# Patient Record
Sex: Female | Born: 1995 | Race: White | Hispanic: No | Marital: Single | Smoking: Never smoker
Health system: Southern US, Community
[De-identification: ages and names within clinical notes are randomized; demographics above are authoritative.]

## PROBLEM LIST (undated history)

## (undated) HISTORY — PX: WISDOM TOOTH EXTRACTION: SHX21

---
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
## 2014-10-01 ENCOUNTER — Emergency Department
Admit: 2014-10-01 | Disposition: A | Discharge status: Home / Self Care | Payer: Self-pay | Admitting: Emergency Medicine

## 2014-10-02 LAB — URINALYSIS, COMPLETE
BILIRUBIN, UR: NEGATIVE
Blood: NEGATIVE
GLUCOSE, UR: NEGATIVE mg/dL (ref 0–75)
KETONE: NEGATIVE
LEUKOCYTE ESTERASE: NEGATIVE
Nitrite: NEGATIVE
PH: 6 (ref 4.5–8.0)
Protein: NEGATIVE
Specific Gravity: 1.016 (ref 1.003–1.030)
WBC UR: 2 /HPF (ref 0–5)

## 2014-10-02 LAB — CBC WITH DIFFERENTIAL/PLATELET
BASOS PCT: 1 %
Basophil #: 0.1 10*3/uL (ref 0.0–0.1)
Eosinophil #: 0.1 10*3/uL (ref 0.0–0.7)
Eosinophil %: 1.5 %
HCT: 39.5 % (ref 35.0–47.0)
HGB: 13 g/dL (ref 12.0–16.0)
LYMPHS ABS: 3.1 10*3/uL (ref 1.0–3.6)
Lymphocyte %: 38.2 %
MCH: 29.2 pg (ref 26.0–34.0)
MCHC: 32.8 g/dL (ref 32.0–36.0)
MCV: 89 fL (ref 80–100)
Monocyte #: 0.5 x10 3/mm (ref 0.2–0.9)
Monocyte %: 6.1 %
Neutrophil #: 4.4 10*3/uL (ref 1.4–6.5)
Neutrophil %: 53.2 %
PLATELETS: 258 10*3/uL (ref 150–440)
RBC: 4.44 10*6/uL (ref 3.80–5.20)
RDW: 12.9 % (ref 11.5–14.5)
WBC: 8.2 10*3/uL (ref 3.6–11.0)

## 2014-10-02 LAB — COMPREHENSIVE METABOLIC PANEL
ALBUMIN: 4 g/dL
AST: 21 U/L
Alkaline Phosphatase: 48 U/L
Anion Gap: 8 (ref 7–16)
BILIRUBIN TOTAL: 0.7 mg/dL
BUN: 10 mg/dL
Calcium, Total: 9.1 mg/dL
Chloride: 105 mmol/L
Co2: 26 mmol/L
Creatinine: 0.71 mg/dL
GLUCOSE: 97 mg/dL
Potassium: 4 mmol/L
SGPT (ALT): 15 U/L
Sodium: 139 mmol/L
Total Protein: 7.3 g/dL

## 2014-10-02 LAB — TROPONIN I: Troponin-I: <0.03 ng/mL

## 2014-10-02 LAB — LIPASE, BLOOD: Lipase: 27 U/L

## 2014-10-04 LAB — URINE CULTURE

## 2015-08-02 ENCOUNTER — Ambulatory Visit (INDEPENDENT_AMBULATORY_CARE_PROVIDER_SITE_OTHER): Payer: No Typology Code available for payment source | Admitting: Obstetrics and Gynecology

## 2015-08-02 ENCOUNTER — Encounter: Payer: Self-pay | Admitting: Obstetrics and Gynecology

## 2015-08-02 VITALS — BP 102/60 | HR 64 | Resp 14 | Ht 63.0 in | Wt 122.0 lb

## 2015-08-02 DIAGNOSIS — R1031 Right lower quadrant pain: Secondary | ICD-10-CM | POA: Diagnosis not present

## 2015-08-02 DIAGNOSIS — Z113 Encounter for screening for infections with a predominantly sexual mode of transmission: Secondary | ICD-10-CM

## 2015-08-02 DIAGNOSIS — Z124 Encounter for screening for malignant neoplasm of cervix: Secondary | ICD-10-CM

## 2015-08-02 DIAGNOSIS — R350 Frequency of micturition: Secondary | ICD-10-CM | POA: Diagnosis not present

## 2015-08-02 DIAGNOSIS — Z01419 Encounter for gynecological examination (general) (routine) without abnormal findings: Secondary | ICD-10-CM | POA: Diagnosis not present

## 2015-08-02 NOTE — Progress Notes (Signed)
Patient ID: Rachael Keller, female   DOB: 10-17-1995, 20 y.o.   MRN: [id number] 20 y.o. [id number] SingleCaucasianF here for annual exam.  She c/o pain right mid abdominal starting 2 days ago, intermittent, getting better, mild. No diarrhea, constipation or gas. No fevers, no nausea or emesis.  She is OCP's, saturates a regular tampon all day. Not currently sexually active, uses condoms. Last active January.  Period Cycle (Days): 28 Period Duration (Days): 3-4 days  Period Pattern: Regular Menstrual Flow: Moderate Menstrual Control: Tampon Dysmenorrhea: (!) Moderate Dysmenorrhea Symptoms: Cramping  Cramps are just intermittent.   Patient's last menstrual period was 07/15/2015.          Sexually active: Yes.    The current method of family planning is OCP (estrogen/progesterone).    Exercising: Yes.    cardio Smoker:  no  Health Maintenance: Pap:  Never History of abnormal Pap:  N/A TDaP:  2014 Gardasil: no    reports that she has never smoked. She has never used smokeless tobacco. She reports that she drinks about 1.8 - 3.0 oz of alcohol per week. She reports that she does not use illicit drugs.She is a Holiday representative at [employer].   History reviewed. No pertinent past medical history.  Past Surgical History  Procedure Laterality Date  . Wisdom tooth extraction      Current Outpatient Prescriptions  Medication Sig Dispense Refill  . norgestimate-ethinyl estradiol (ORTHO-CYCLEN,SPRINTEC,PREVIFEM) 0.25-35 MG-MCG tablet Take 1 tablet by mouth daily.     No current facility-administered medications for this visit.    Family History  Problem Relation Age of Onset  . Heart attack Paternal Uncle   . Cancer Paternal Grandmother   . Cancer Paternal Grandfather     Review of Systems  Constitutional: Negative.   HENT: Negative.   Eyes: Negative.   Respiratory: Negative.   Cardiovascular: Negative.   Gastrointestinal: Negative.   Endocrine: Negative.   Genitourinary:  Negative.        Painful periods  Musculoskeletal: Negative.   Skin: Negative.   Allergic/Immunologic: Negative.   Neurological: Negative.   Psychiatric/Behavioral: Negative.   She c/o frequent urination, voids every couple of hours, normal amounts. She can hold her bladder, no dysuria. She drinks coffee, 1-2 cups a day.  Exam:   BP 102/60 mmHg  Pulse 64  Resp 14  Ht  (1.6 m)  Wt 122 lb (55.339 kg)  BMI 21.62 kg/m2  LMP 07/15/2015  Weight change: @ Height:   Height:  (160 cm)  Ht Readings from Last 3 Encounters:  08/02/15  (1.6 m)  09/25/13  (1.6 m) (31 %*, Z = -0.50)   * Growth percentiles are based on CDC 2-20 Years data.    General appearance: alert, cooperative and appears stated age Head: Normocephalic, without obvious abnormality, atraumatic Neck: no adenopathy, supple, symmetrical, trachea midline and thyroid normal to inspection and palpation Lungs: clear to auscultation bilaterally Breasts: normal appearance, no masses or tenderness Heart: regular rate and rhythm Abdomen: soft, non-tender; bowel sounds normal; no masses,  no organomegaly. +distended Extremities: extremities normal, atraumatic, no cyanosis or edema Skin: Skin color, texture, turgor normal. No rashes or lesions Lymph nodes: Cervical, supraclavicular, and axillary nodes normal. No abnormal inguinal nodes palpated Neurologic: Grossly normal   Pelvic: External genitalia:  no lesions              Urethra:  normal appearing urethra with no masses, tenderness or lesions  Bartholins and Skenes: normal                 Vagina: normal appearing vagina with normal color and discharge, no lesions              Cervix: no lesions               Bimanual Exam:  Uterus:  normal size, contour, position, consistency, mobility, non-tender and anteverted              Adnexa: slightly tender and full in the right adnexa, suspect stool                Rectovaginal: Confirms                Anus:  normal sphincter tone, no lesions  Chaperone was present for exam.  A:  Well Woman with normal exam  Urinary frequency  Abdominal pain x 2 days, right mid abdomen, distended, improving. Suspect GI   P:   Pap  STD testing  If pain persists return for a gyn u/s, suspect her pain is GI  Urine for ua, c&s  Try cutting out caffeine  gardasil information reviewed, she will check with her mother if she has had it  [facility]  Condoms if sexually active  Reviewed breast self exam  Discussed calcium/vit d

## 2015-08-02 NOTE — Patient Instructions (Signed)
EXERCISE AND DIET:  We recommended that you start or continue a regular exercise program for good health. Regular exercise means any activity that makes your heart beat faster and makes you sweat.  We recommend exercising at least 30 minutes per day at least 3 days a week, preferably 4 or 5.  We also recommend a diet low in fat and sugar.  Inactivity, poor dietary choices and obesity can cause diabetes, heart attack, stroke, and kidney damage, among others.    ALCOHOL AND SMOKING:  Women should limit their alcohol intake to no more than 7 drinks/beers/glasses of wine (combined, not each!) per week. Moderation of alcohol intake to this level decreases your risk of breast cancer and liver damage. And of course, no recreational drugs are part of a healthy lifestyle.  And absolutely no smoking or even second hand smoke. Most people know smoking can cause heart and lung diseases, but did you know it also contributes to weakening of your bones? Aging of your skin?  Yellowing of your teeth and nails?  CALCIUM AND VITAMIN D:  Adequate intake of calcium and Vitamin D are recommended.  The recommendations for exact amounts of these supplements seem to change often, but generally speaking 600 mg of calcium (either carbonate or citrate) and 800 units of Vitamin D per day seems prudent. Certain women may benefit from higher intake of Vitamin D.  If you are among these women, your doctor will have told you during your visit.    PAP SMEARS:  Pap smears, to check for cervical cancer or precancers,  have traditionally been done yearly, although recent scientific advances have shown that most women can have pap smears less often.  However, every woman still should have a physical exam from her gynecologist every year. It will include a breast check, inspection of the vulva and vagina to check for abnormal growths or skin changes, a visual exam of the cervix, and then an exam to evaluate the size and shape of the uterus and  ovaries.  And after 21 years of age, a rectal exam is indicated to check for rectal cancers. We will also provide age appropriate advice regarding health maintenance, like when you should have certain vaccines, screening for sexually transmitted diseases, bone density testing, colonoscopy, mammograms, etc.      

## 2015-08-03 LAB — URINALYSIS, MICROSCOPIC ONLY
BACTERIA UA: NONE SEEN [HPF]
Casts: NONE SEEN [LPF]
Crystals: NONE SEEN [HPF]
RBC / HPF: NONE SEEN RBC/HPF (ref <=2)
Squamous Epithelial / LPF: NONE SEEN [HPF] (ref <=5)
WBC, UA: NONE SEEN WBC/HPF (ref <=5)
YEAST: NONE SEEN [HPF]

## 2015-08-03 LAB — STD PANEL
HEP B S AG: NEGATIVE
HIV: NONREACTIVE

## 2015-08-03 LAB — IPS PAP SMEAR ONLY

## 2015-08-03 LAB — URINE CULTURE
COLONY COUNT: NO GROWTH
ORGANISM ID, BACTERIA: NO GROWTH

## 2015-08-03 LAB — HEPATITIS C ANTIBODY: HCV Ab: NEGATIVE

## 2015-08-06 LAB — IPS N GONORRHOEA AND CHLAMYDIA BY PCR

## 2017-02-26 IMAGING — US ABDOMEN ULTRASOUND LIMITED
1 series · 14 of 25 positions shown · non-contrast
Comparison: None.

CLINICAL DATA: Right upper quadrant pain and nausea for 2 hr.

EXAM:
US ABDOMEN LIMITED - RIGHT UPPER QUADRANT

[Series 1: abdomen ultrasound limited · 0.17mm/px · 14 of 36 slices shown]
[im 1/36]
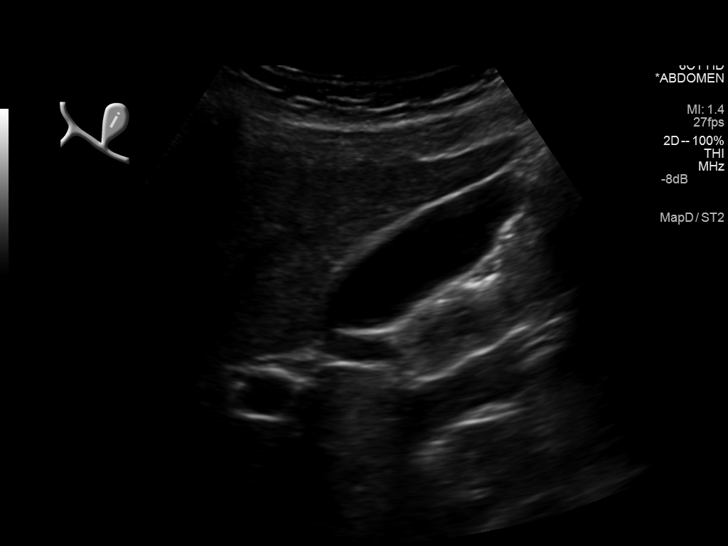
[im 3/36]
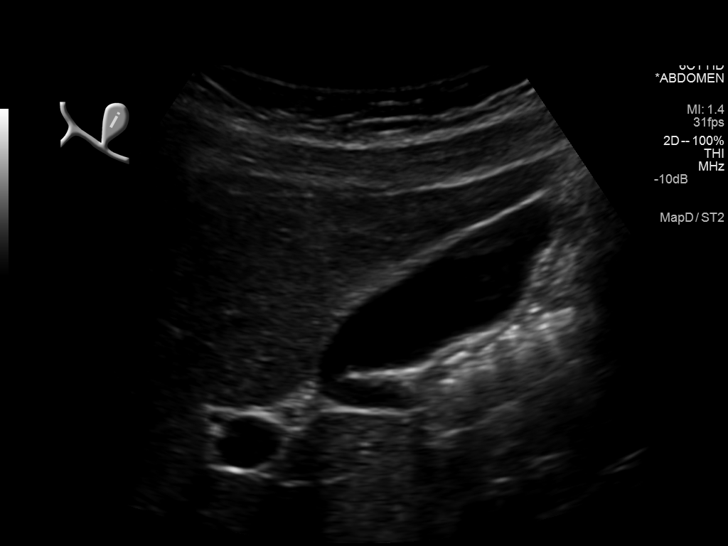
[im 6/36]
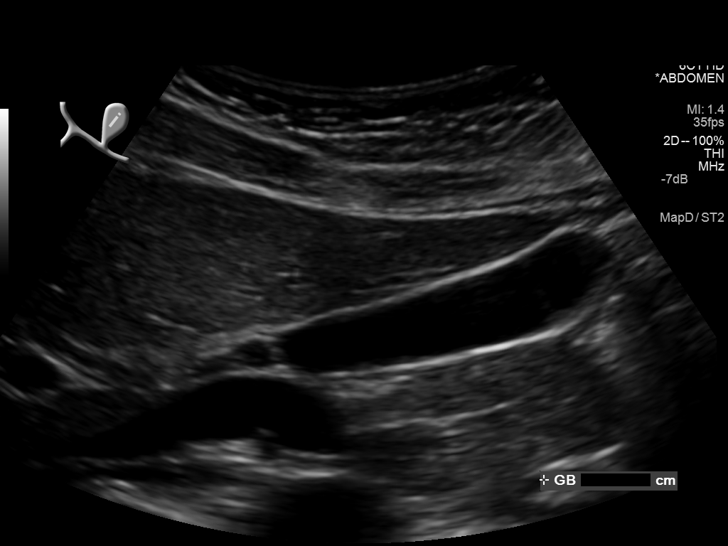
[im 9/36]
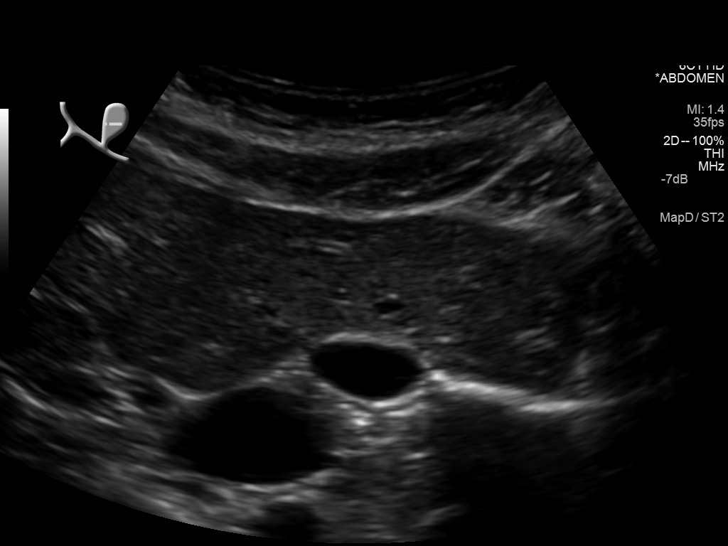
[im 12/36]
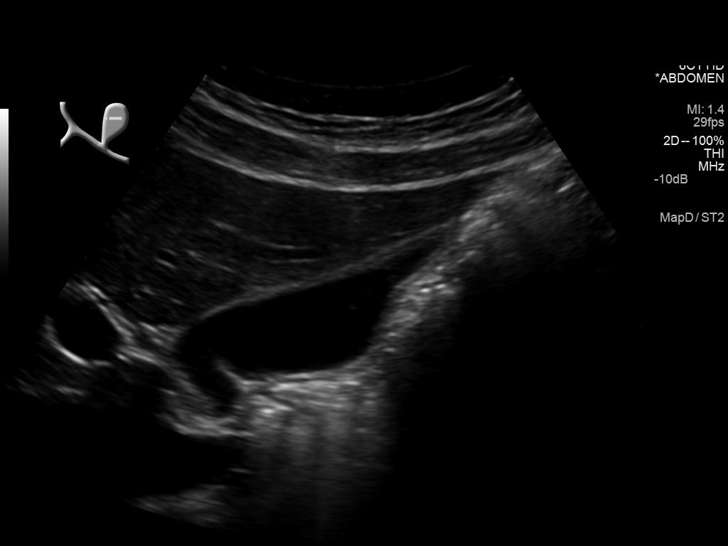
[im 14/36]
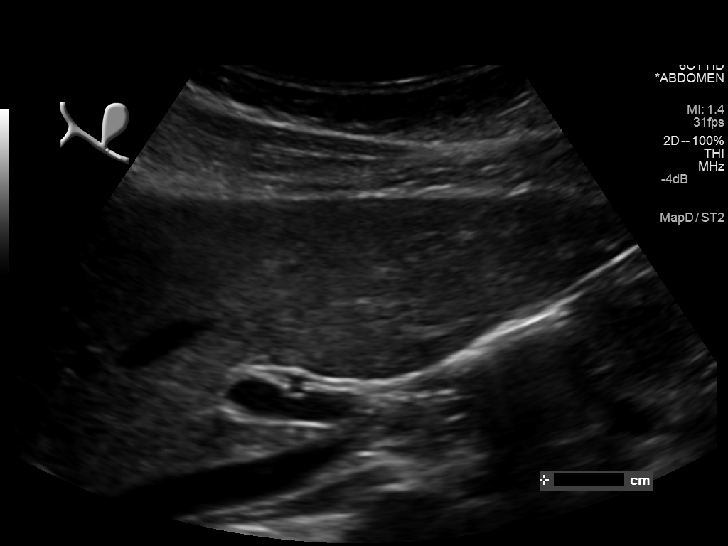
[im 17/36]
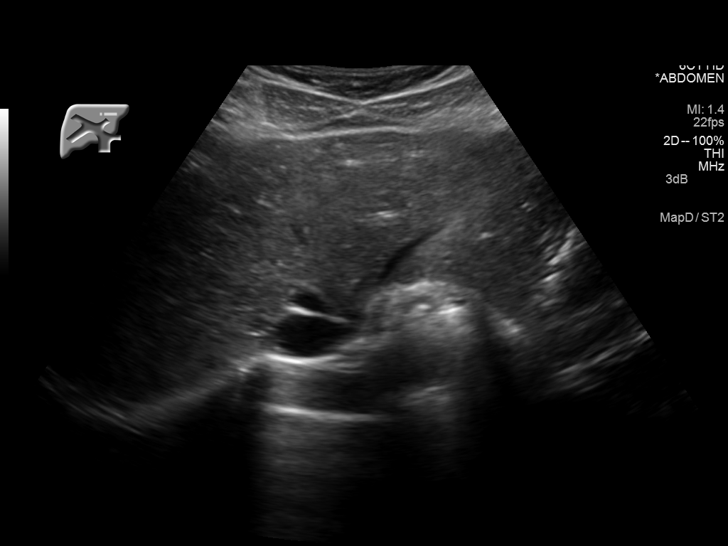
[im 19/36]
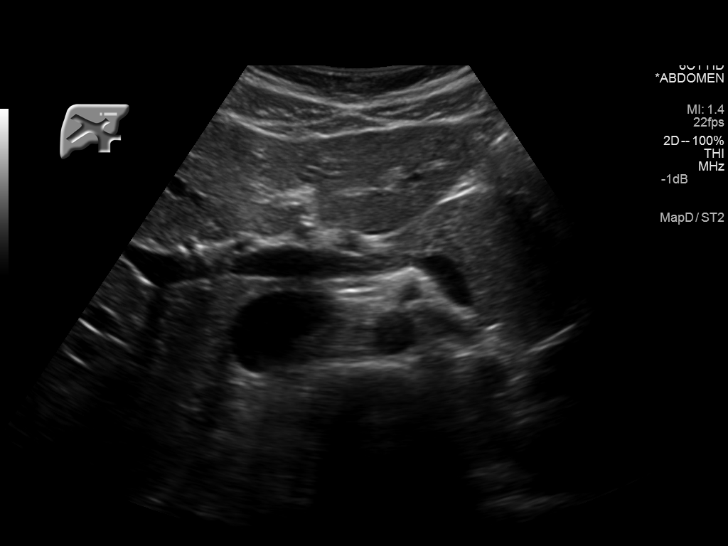
[im 22/36]
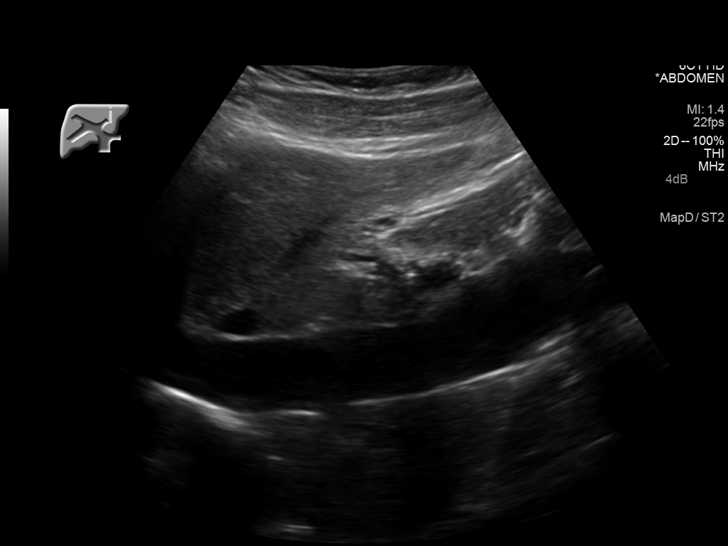
[im 24/36]
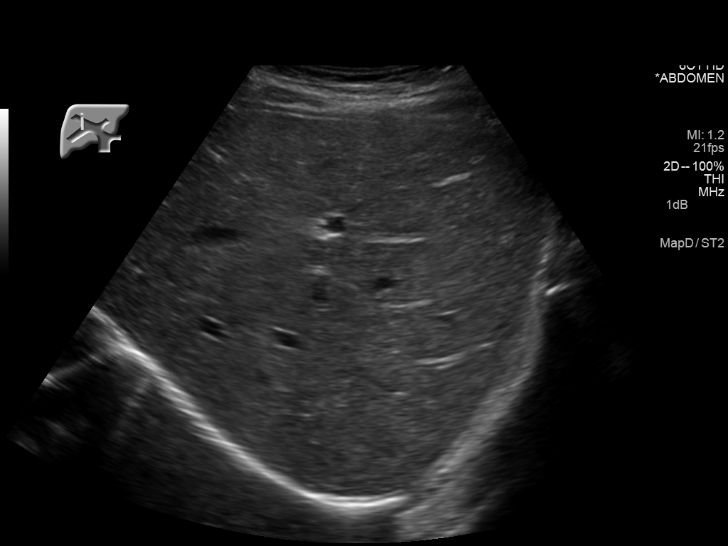
[im 27/36]
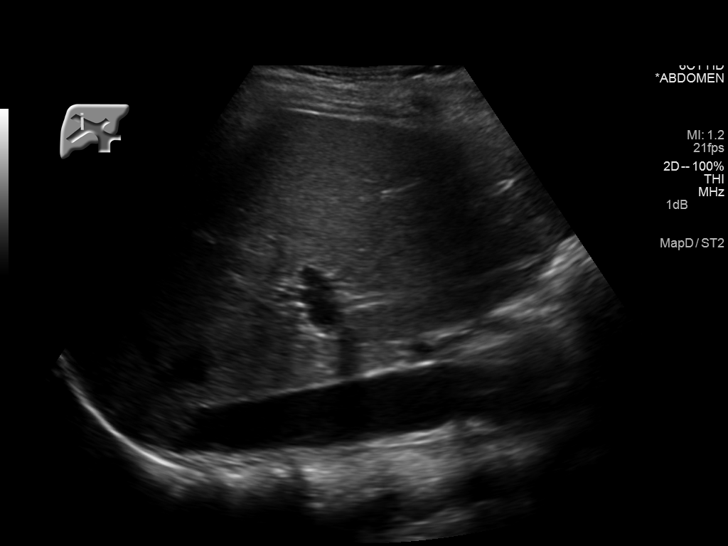
[im 30/36]
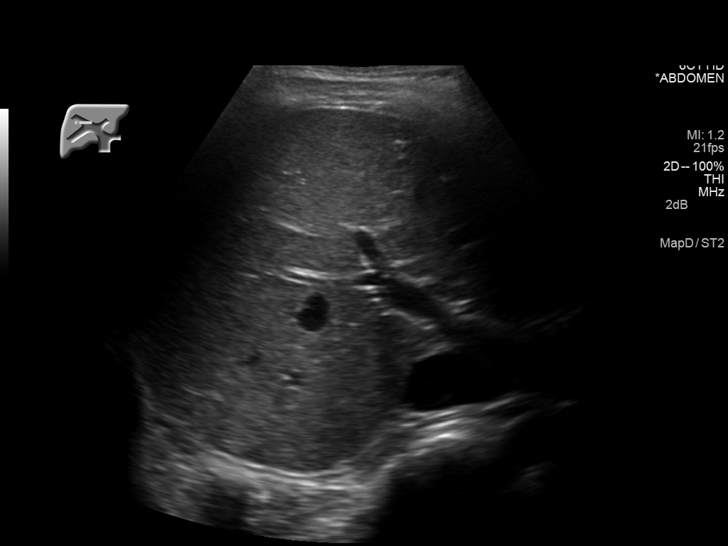
[im 33/36]
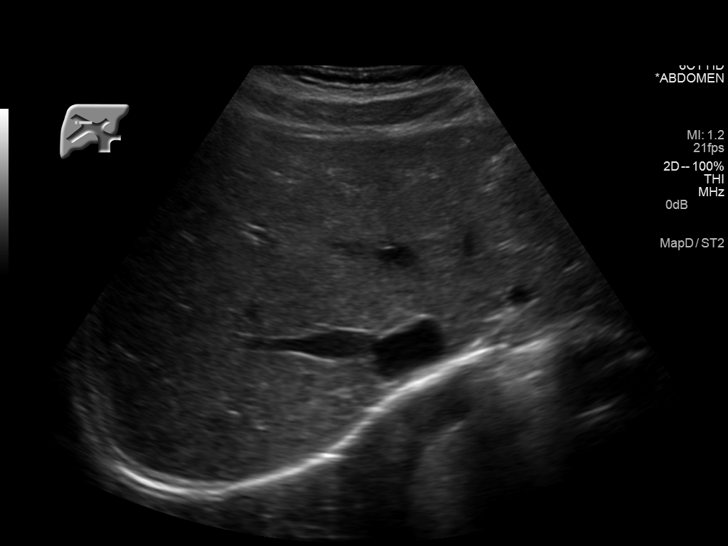
[im 36/36]
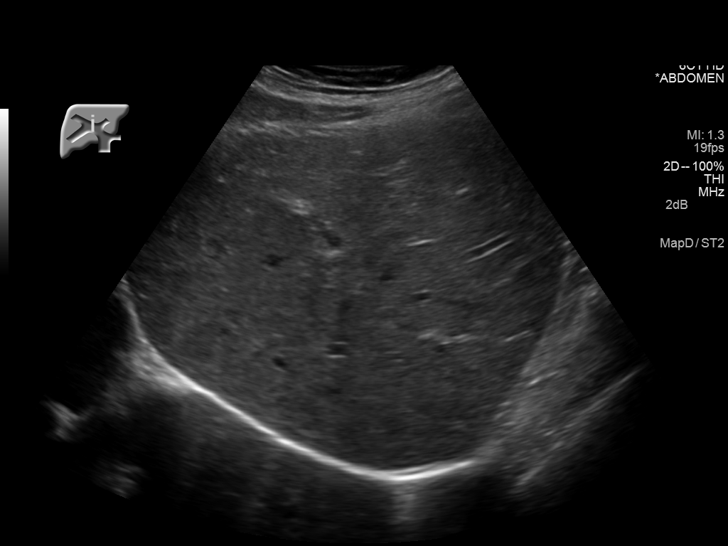

[14 of 25 positions shown; findings below may reference images not displayed]

FINDINGS: Gallbladder:

No gallstones or wall thickening visualized. No sonographic Murphy
sign noted.

Common bile duct:

Diameter: 1 mm

Liver:

No focal lesion identified. Within normal limits in parenchymal
echogenicity. Antegrade flow in the imaged portal venous system.
IMPRESSION: Normal right upper quadrant ultrasound.
